# Patient Record
Sex: Female | Born: 1960 | Hispanic: No | Marital: Single | Smoking: Never smoker
Health system: Southern US, Community
[De-identification: ages and names within clinical notes are randomized; demographics above are authoritative.]

---

## 2019-08-11 ENCOUNTER — Emergency Department (HOSPITAL_BASED_OUTPATIENT_CLINIC_OR_DEPARTMENT_OTHER): Payer: BC Managed Care – PPO

## 2019-08-11 ENCOUNTER — Other Ambulatory Visit: Payer: Self-pay

## 2019-08-11 ENCOUNTER — Emergency Department (HOSPITAL_BASED_OUTPATIENT_CLINIC_OR_DEPARTMENT_OTHER)
Admission: EM | Admit: 2019-08-11 | Discharge: 2019-08-11 | Disposition: A | Discharge status: Home / Self Care | Payer: BC Managed Care – PPO | Attending: Emergency Medicine | Admitting: Emergency Medicine

## 2019-08-11 ENCOUNTER — Encounter (HOSPITAL_BASED_OUTPATIENT_CLINIC_OR_DEPARTMENT_OTHER): Payer: Self-pay

## 2019-08-11 DIAGNOSIS — J1289 Other viral pneumonia: Secondary | ICD-10-CM | POA: Insufficient documentation

## 2019-08-11 DIAGNOSIS — U071 COVID-19: Secondary | ICD-10-CM | POA: Insufficient documentation

## 2019-08-11 DIAGNOSIS — R05 Cough: Secondary | ICD-10-CM | POA: Diagnosis present

## 2019-08-11 DIAGNOSIS — J129 Viral pneumonia, unspecified: Secondary | ICD-10-CM

## 2019-08-11 DIAGNOSIS — J069 Acute upper respiratory infection, unspecified: Secondary | ICD-10-CM

## 2019-08-11 NOTE — ED Triage Notes (Signed)
Per pt and daughter/interpreter-pt with flu like sx x 3-4 days-NAD-steady gait

## 2019-08-11 NOTE — ED Notes (Signed)
C/o cough. Ha, body aches and nausea x 3-4 days

## 2019-08-11 NOTE — Discharge Instructions (Addendum)
Your chest x-ray today appears to have a viral pneumonia as seen in COVID-19 infection. A Covid test has been sent out today, recommend that she quarantine at home for the next 10 days. Follow-up with your primary care provider in the next few days.  Return to the ER for new or worsening symptoms.

## 2019-08-11 NOTE — ED Provider Notes (Signed)
[facility] EMERGENCY DEPARTMENT Provider Note   CSN: [id number] Arrival date & time: 08/11/19  1535     History Chief Complaint  Patient presents with  . Cough    Cynthia Murphy is a 59 y.o. female.  59 year old female presents with cough x3 to 4 days with body aches, headache, nausea.  History is provided by patient's daughter who translates.  Denies changes in bowel or bladder habits, vomiting, fevers, chills, loss of sense of smell or taste.  Patient is exposed to her daughter who was sick with similar symptoms recently, daughter was not tested for COVID-19, no known exposure to COVID-19.  No history of asthma or chronic lung disease.  Patient is a non-smoker.  No other complaints or concerns.  Cynthia Murphy was evaluated in Emergency Department on 08/11/2019 for the symptoms described in the history of present illness. She was evaluated in the context of the global COVID-19 pandemic, which necessitated consideration that the patient might be at risk for infection with the SARS-CoV-2 virus that causes COVID-19. Institutional protocols and algorithms that pertain to the evaluation of patients at risk for COVID-19 are in a state of rapid change based on information released by regulatory bodies including the CDC and federal and state organizations. These policies and algorithms were followed during the patient's care in the ED.         History reviewed. No pertinent past medical history.  There are no problems to display for this patient.   History reviewed. No pertinent surgical history.   OB History   No obstetric history on file.     No family history on file.  Social History   Tobacco Use  . Smoking status: Never Smoker  . Smokeless tobacco: Never Used  Substance Use Topics  . Alcohol use: Never  . Drug use: Never    Home Medications Prior to Admission medications   Not on File    Allergies    Patient has no known allergies.  Review of Systems     Review of Systems  Constitutional: Negative for fever.  HENT: Negative for congestion.   Respiratory: Positive for cough. Negative for shortness of breath.   Cardiovascular: Negative for chest pain.  Gastrointestinal: Positive for nausea. Negative for abdominal pain, constipation, diarrhea and vomiting.  Genitourinary: Negative for dysuria.  Musculoskeletal: Positive for arthralgias and myalgias.  Skin: Negative for rash and wound.  Allergic/Immunologic: Negative for immunocompromised state.  Neurological: Positive for headaches. Negative for weakness.  Hematological: Negative for adenopathy.  Psychiatric/Behavioral: Negative for confusion.  All other systems reviewed and are negative.   Physical Exam Updated Vital Signs BP 114/72 (BP Location: Right Arm)   Pulse 66   Temp 98.7 F (37.1 C) (Oral)   Resp 16   Ht 5' (1.524 m)   Wt 57.3 kg   SpO2 100%   BMI 24.68 kg/m   Physical Exam Vitals and nursing note reviewed.  Constitutional:      General: She is not in acute distress.    Appearance: She is well-developed. She is not diaphoretic.  HENT:     Head: Normocephalic and atraumatic.  Cardiovascular:     Rate and Rhythm: Normal rate and regular rhythm.     Pulses: Normal pulses.     Heart sounds: Normal heart sounds.  Pulmonary:     Effort: Pulmonary effort is normal.     Breath sounds: Normal breath sounds.  Abdominal:     Palpations: Abdomen is soft.  Tenderness: There is no abdominal tenderness.  Musculoskeletal:     Cervical back: Neck supple.     Right lower leg: No edema.     Left lower leg: No edema.  Skin:    General: Skin is warm and dry.     Findings: No lesion or rash.  Neurological:     Mental Status: She is alert and oriented to person, place, and time.  Psychiatric:        Behavior: Behavior normal.     ED Results / Procedures / Treatments   Labs (all labs ordered are listed, but only abnormal results are displayed) Labs Reviewed  SARS  CORONAVIRUS 2 (TAT 6-24 HRS)    EKG None  Radiology DG Chest Port 1 View  Result Date: 08/11/2019 CLINICAL DATA:  Cough, nausea, body aches, fever for 4 days EXAM: PORTABLE CHEST 1 VIEW COMPARISON:  04/06/2018 FINDINGS: Single frontal view of the chest demonstrates an unremarkable cardiac silhouette. There are faint ground-glass opacities projecting over the periphery of the right lung and at the left lung base. No effusion or pneumothorax. No acute bony abnormality. IMPRESSION: 1. Subtle bilateral ground-glass opacities which could reflect early multifocal pneumonia given clinical presentation. Electronically Signed   By: Sharlet Salina M.D.   On: 08/11/2019 16:42    Procedures Procedures (including critical care time)  Medications Ordered in ED Medications - No data to display  ED Course  I have reviewed the triage vital signs and the nursing notes.  Pertinent labs & imaging results that were available during my care of the patient were reviewed by me and considered in my medical decision making (see chart for details).  Clinical Course as of Aug 11 1707  Fri Aug 11, 2019  [id number] 59 year old female with complaint of cough with body aches x3 to 4 days.  Patient is exposed to her daughter who had a similar illness and did not get tested for COVID-19.  Patient is otherwise healthy without chronic lung disease.  Vitals reviewed and reassuring with normal ranges including 100% oxygenation on room air.  Lung sounds are clear.  Patient has a frequent dry cough otherwise is well-appearing. Chest x-ray shows subtle bilateral groundglass opacities which could reflect early multifocal pneumonia.  Presentation consistent with likely Covid infection.  Patient will be tested today, advised to quarantine.  Recommend monitor symptoms, return to ER for new or worsening symptoms otherwise recommend virtual recheck with PCP.  Patient and family understand to quarantine to avoid further spread.   [LM]      Clinical Course User Index [LM] Alden Hipp   MDM Rules/Calculators/A&P                      Final Clinical Impression(s) / ED Diagnoses Final diagnoses:  Viral URI with cough  Viral pneumonia    Rx / DC Orders ED Discharge Orders    None       Sedona, Wenk, PA-C 08/11/19 1709    Arby Barrette, MD 08/11/19 [phone]

## 2019-08-12 LAB — SARS CORONAVIRUS 2 (TAT 6-24 HRS): SARS Coronavirus 2: POSITIVE — AB

## 2019-08-13 ENCOUNTER — Telehealth: Payer: Self-pay | Admitting: Unknown Physician Specialty

## 2019-08-13 ENCOUNTER — Telehealth: Payer: Self-pay

## 2019-08-13 NOTE — Telephone Encounter (Signed)
Pt notified of positive COVID-19 test results. Pt verbalized understanding. Pt reports that they are feeling tired and coughing less..Pt advised to remain in self quarantine until at least 10 days since symptom onset And 3 consecutive days fever free without antipyretics And improvement in respiratory symptoms. Patient advised to utilize over the counter medications to treat symptoms. Pt advised to seek treatment in the ED if respiratory issues/distress develops.Pt advised they should only leave home to seek and medical care and must wear a mask in public. Pt instructed to limit contact with family members or caregivers in the home. Pt advised to practice social distancing and to continue to use good preventative care measures such has frequent hand washing, staying out of crowds and cleaning hard surfaces frequently touched in the home.Pt informed that the health department will likely follow up and may have additional recommendations. Will notify [place]. [facility]. Spoke with daughter.

## 2019-08-13 NOTE — Telephone Encounter (Signed)
Discussed with patient's daughter about Covid symptoms and the use of bamlanivimab, a monoclonal antibody infusion for those with mild to moderate Covid symptoms and at a high risk of hospitalization.  Pt is not qualified for this infusion at the [facility] infusion center due to lack of co-morbid conditions

## 2020-11-13 IMAGING — DX DG CHEST 1V PORT
1 series · 1 of 1 positions shown · non-contrast
Comparison: 04/06/2018

CLINICAL DATA: Cough, nausea, body aches, fever for 4 days

EXAM:
PORTABLE CHEST 1 VIEW

[chest ap]
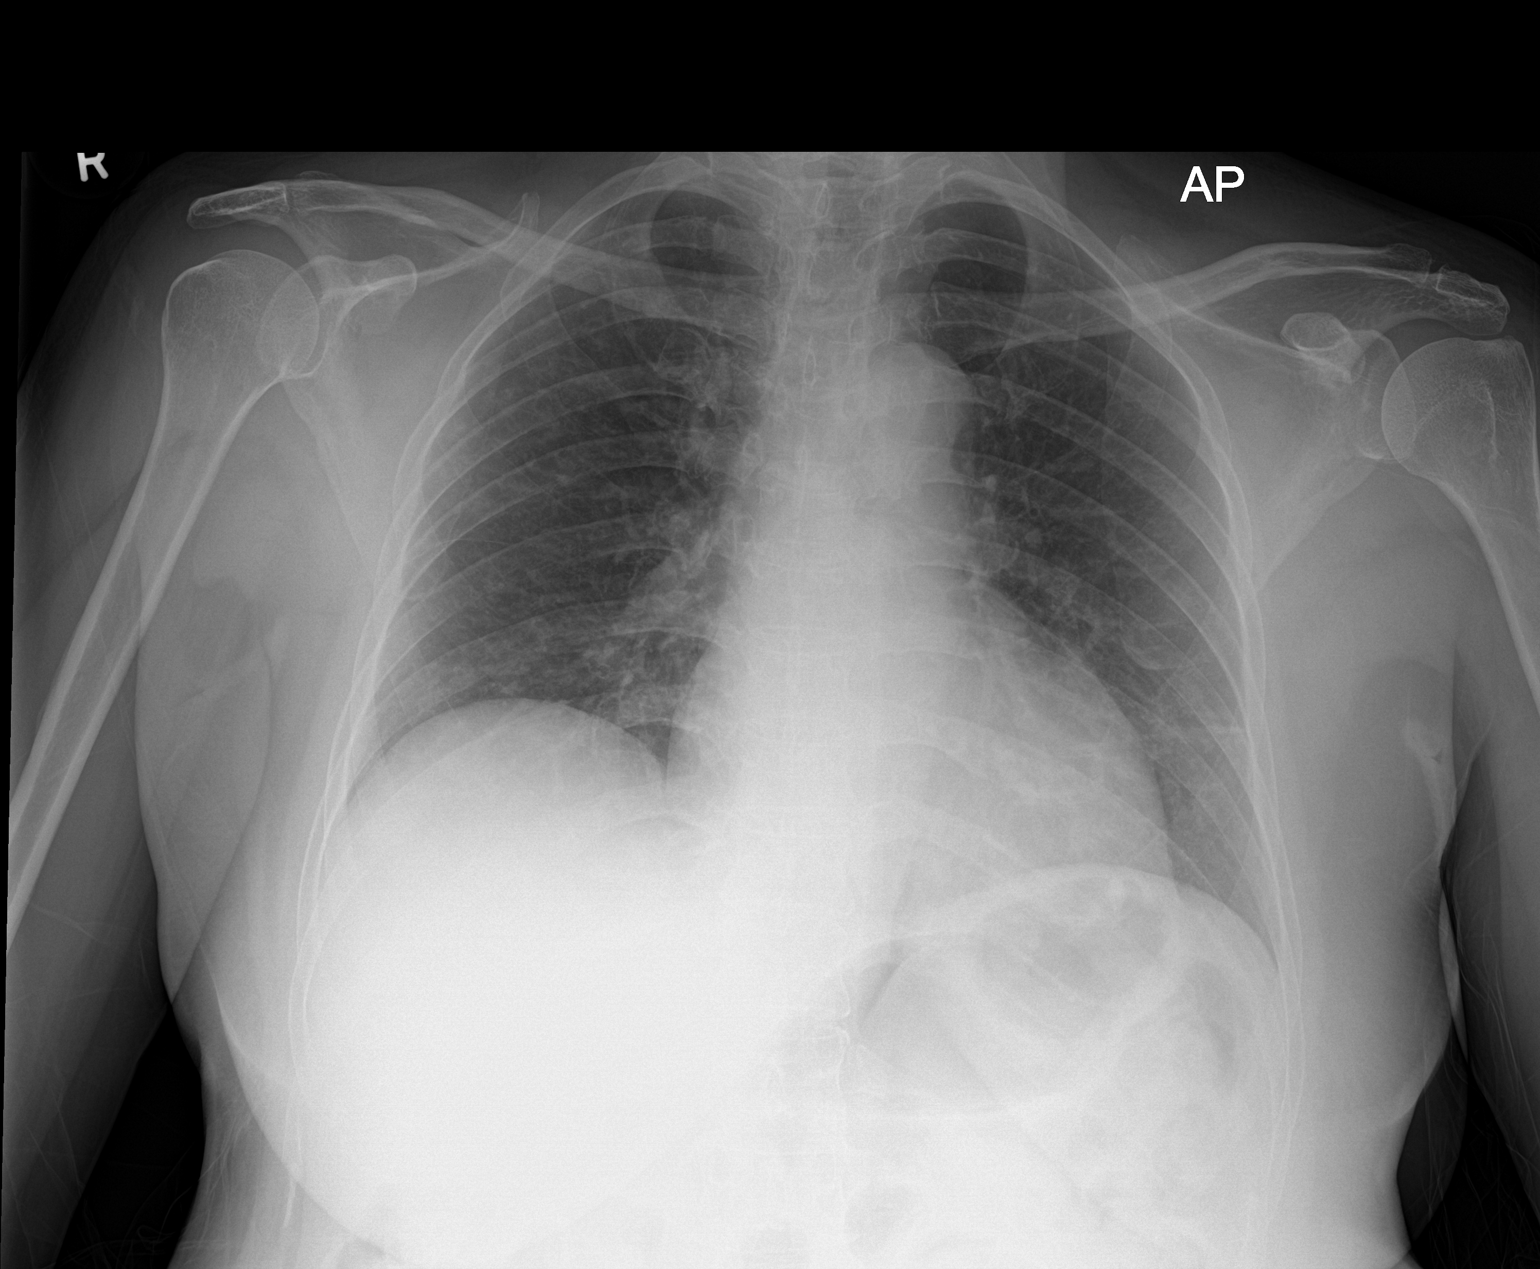

[1 of 1 positions shown; findings below may reference images not displayed]

FINDINGS: Single frontal view of the chest demonstrates an unremarkable
cardiac silhouette. There are faint ground-glass opacities
projecting over the periphery of the right lung and at the left lung
base. No effusion or pneumothorax. No acute bony abnormality.
IMPRESSION: 1. Subtle bilateral ground-glass opacities which could reflect early
multifocal pneumonia given clinical presentation.
